# Patient Record
Sex: Female | Born: 1940 | Race: Black or African American | Hispanic: No
Health system: Southern US, Community
[De-identification: ages and names within clinical notes are randomized; demographics above are authoritative.]

## PROBLEM LIST (undated history)

## (undated) DIAGNOSIS — E119 Type 2 diabetes mellitus without complications: Secondary | ICD-10-CM

---

## 2018-03-10 ENCOUNTER — Ambulatory Visit
Admission: RE | Admit: 2018-03-10 | Discharge: 2018-03-10 | Disposition: A | Discharge status: Home / Self Care | Payer: No Typology Code available for payment source | Source: Ambulatory Visit | Attending: Internal Medicine | Admitting: Internal Medicine

## 2018-03-10 ENCOUNTER — Other Ambulatory Visit: Payer: Self-pay | Admitting: Internal Medicine

## 2018-03-10 DIAGNOSIS — A150 Tuberculosis of lung: Secondary | ICD-10-CM

## 2019-11-15 ENCOUNTER — Emergency Department (HOSPITAL_COMMUNITY): Payer: Self-pay

## 2019-11-15 ENCOUNTER — Encounter (HOSPITAL_COMMUNITY): Payer: Self-pay | Admitting: Emergency Medicine

## 2019-11-15 ENCOUNTER — Other Ambulatory Visit: Payer: Self-pay

## 2019-11-15 ENCOUNTER — Emergency Department (HOSPITAL_COMMUNITY)
Admission: EM | Admit: 2019-11-15 | Discharge: 2019-11-15 | Disposition: A | Discharge status: Home / Self Care | Payer: Self-pay | Attending: Emergency Medicine | Admitting: Emergency Medicine

## 2019-11-15 DIAGNOSIS — M25552 Pain in left hip: Secondary | ICD-10-CM | POA: Insufficient documentation

## 2019-11-15 DIAGNOSIS — E119 Type 2 diabetes mellitus without complications: Secondary | ICD-10-CM | POA: Insufficient documentation

## 2019-11-15 DIAGNOSIS — W1830XA Fall on same level, unspecified, initial encounter: Secondary | ICD-10-CM | POA: Insufficient documentation

## 2019-11-15 DIAGNOSIS — W19XXXA Unspecified fall, initial encounter: Secondary | ICD-10-CM

## 2019-11-15 HISTORY — DX: Type 2 diabetes mellitus without complications: E11.9

## 2019-11-15 MED ORDER — LIDOCAINE 5 % EX PTCH
1.0000 | MEDICATED_PATCH | CUTANEOUS | Status: DC
  Administered 2019-11-15: 1 via TRANSDERMAL
  Filled 2019-11-15: qty 1

## 2019-11-15 MED ORDER — LIDOCAINE 5 % EX OINT: 1.0000 "application " | TOPICAL_OINTMENT | CUTANEOUS | 0 refills | Status: AC | PRN

## 2019-11-15 MED ORDER — ACETAMINOPHEN 325 MG PO TABS
325.0000 mg | ORAL_TABLET | Freq: Once | ORAL | Status: AC
  Administered 2019-11-15: 325 mg via ORAL
  Filled 2019-11-15: qty 1

## 2019-11-15 NOTE — Discharge Instructions (Signed)
You were seen in the emergency department today with left hip pain. Your x-ray and CT scans were normal. Please use Tylenol and the lidocaine medication as prescribed. Return with any new or worsening symptoms. Please call your primary doctor first thing tomorrow morning to schedule the next available follow up appointment.

## 2019-11-15 NOTE — ED Notes (Signed)
Pt was discharged from the ED. Pt read and understood discharge paperwork. Pt had vital signs completed. Pt conscious, breathing, and A&Ox4. No distress noted. Pt speaking in complete sentences. Pt brought out of the ED via wheelchair.  

## 2019-11-15 NOTE — ED Triage Notes (Signed)
Pt reports falling last night due to her leg giving out on her. Pain to left hip and leg. Hx of DM. Denies hitting head.

## 2019-11-15 NOTE — ED Provider Notes (Signed)
Emergency Department Provider Note   I have reviewed the triage vital signs and the nursing notes.   HISTORY  Chief Complaint Fall  Family member interpreting at patient's request.  HPI Kristy Rodriguez is a 79 y.o. female with PMH of DM presents to the emergency department for evaluation of left hip pain after fall yesterday.  The patient states that she occasionally has falls but denies any presyncope type symptoms.  No chest pain, shortness of breath, palpitations.  She has pain in the left hip which is more posterior and radiating down the left leg.  No numbness or weakness.  No lower back pain.  She has been ambulatory on the hip but has had significant discomfort.  Family has been giving Tylenol for pain with only mild improvement in symptoms. No UTI symptoms or flank pain.    Past Medical History:  Diagnosis Date  . Diabetes mellitus without complication (HCC)    There are no problems to display for this patient.  Allergies Patient has no allergy information on record.  No family history on file.  Social History Social History   Tobacco Use  . Smoking status: Not on file  Substance Use Topics  . Alcohol use: Not on file  . Drug use: Not on file    Review of Systems  Constitutional: No fever/chills Eyes: No visual changes. ENT: No sore throat. Cardiovascular: Denies chest pain. Respiratory: Denies shortness of breath. Gastrointestinal: No abdominal pain.  No nausea, no vomiting.  No diarrhea.  No constipation. Genitourinary: Negative for dysuria. Musculoskeletal: Negative for back pain. Positive left hip/leg pain.  Skin: Negative for rash. Neurological: Negative for headaches, focal weakness or numbness.  10-point ROS otherwise negative.  ____________________________________________   PHYSICAL EXAM:  VITAL SIGNS: ED Triage Vitals  Enc Vitals Group     BP 11/15/19 1510 116/65     Pulse Rate 11/15/19 1510 92     Resp 11/15/19 1510 16     Temp  11/15/19 1510 98.3 F (36.8 C)     Temp Source 11/15/19 1510 Oral     SpO2 11/15/19 1510 97 %     Weight 11/15/19 1511 130 lb (59 kg)     Height 11/15/19 1511 5\' 5"  (1.651 m)   Constitutional: Alert and oriented. Well appearing and in no acute distress. Eyes: Conjunctivae are normal.  Head: Atraumatic. Nose: No congestion/rhinnorhea. Mouth/Throat: Mucous membranes are moist.   Neck: No stridor.   Cardiovascular: Normal rate, regular rhythm. Good peripheral circulation. Grossly normal heart sounds.   Respiratory: Normal respiratory effort.  No retractions. Lungs CTAB. Gastrointestinal: Soft and nontender. No distention.  Musculoskeletal: No lower extremity tenderness nor edema. No gross deformities of extremities.  Mild tenderness to palpation over the superior left buttocks.  No overlying skin changes, ecchymosis, abscess/cellulitis.  Pain with range of motion of the left hip.  No tenderness over the left knee or ankle.  No midline thoracic or lumbar spine tenderness.  Neurologic:  Normal speech and language. No gross focal neurologic deficits are appreciated.  Skin:  Skin is warm, dry and intact. No rash noted.  ____________________________________________  RADIOLOGY  CT Hip Left Wo Contrast  Result Date: 11/15/2019 CLINICAL DATA:  Left knee pain. Status post fall. EXAM: CT OF THE LEFT HIP WITHOUT CONTRAST TECHNIQUE: Multidetector CT imaging of the left hip was performed according to the standard protocol. Multiplanar CT image reconstructions were also generated. COMPARISON:  None. FINDINGS: Bones/Joint/Cartilage No fracture or dislocation. Normal alignment. No joint effusion.  No aggressive osseous lesion. Mild osteoarthritis of bilateral SI joints. Mild bilateral facet arthropathy at L5-S1. Ligaments Ligaments are suboptimally evaluated by CT. Muscles and Tendons Muscles are normal. No muscle atrophy. No intramuscular fluid collection or hematoma. Soft tissue No fluid collection or  hematoma. No soft tissue mass. Peripheral vascular atherosclerotic disease. IMPRESSION: No acute osseous injury of the left hip. Electronically Signed   By: Kathreen Devoid   On: 11/15/2019 19:53   DG Hip Unilat With Pelvis 2-3 Views Left  Result Date: 11/15/2019 CLINICAL DATA:  80 year old female status post fall last night with pain radiating to the left hip and leg. EXAM: DG HIP (WITH OR WITHOUT PELVIS) 2-3V LEFT COMPARISON:  None. FINDINGS: Femoral heads are normally located. Bone mineralization is within normal limits for age. Pelvis appears intact. SI joints appear symmetric. Incidental somewhat bulky dystrophic calcifications at both lower flanks. Negative visible bowel gas pattern. Grossly intact proximal right femur. AP and frogleg lateral views are provided of the proximal left femur which appears intact. IMPRESSION: No acute fracture or dislocation identified about the left hip or pelvis. Electronically Signed   By: Genevie Ann M.D.   On: 11/15/2019 16:11    ____________________________________________   PROCEDURES  Procedure(s) performed:   Procedures  None  ____________________________________________   INITIAL IMPRESSION / ASSESSMENT AND PLAN / ED COURSE  Pertinent labs & imaging results that were available during my care of the patient were reviewed by me and considered in my medical decision making (see chart for details).   Patient presents to the emergency department for evaluation of left hip pain after fall.  Plain films reviewed which show no acute fracture or dislocation.  Plan for CT imaging of the left hip.  The distribution of symptoms is consistent with sciatica.  She has no red flag signs or symptoms to suspect acute spine emergency requiring MRI.   08:10 PM  CT imaging of the left hip reviewed with no osseous abnormality.  Patient feeling somewhat improved after lidocaine ointment here in the emergency department.  Will send home with instructions to continue Tylenol  and lidocaine medications primarily.  Will hold on steroids or other stronger pain medicines given the patient's age and concern for increased fall risk and AMS.  Discussed this with the patient and daughter at bedside.  They will call the primary care doctor tomorrow morning for the next available appointment.  Discussed ED return precautions.  ____________________________________________  FINAL CLINICAL IMPRESSION(S) / ED DIAGNOSES  Final diagnoses:  Fall, initial encounter  Left hip pain     MEDICATIONS GIVEN DURING THIS VISIT:  Medications  lidocaine (LIDODERM) 5 % 1 patch (1 patch Transdermal Patch Applied 11/15/19 1950)  acetaminophen (TYLENOL) tablet 325 mg (325 mg Oral Given 11/15/19 1553)     NEW OUTPATIENT MEDICATIONS STARTED DURING THIS VISIT:  New Prescriptions   LIDOCAINE (XYLOCAINE) 5 % OINTMENT    Apply 1 application topically as needed.    Note:  This document was prepared using Dragon voice recognition software and may include unintentional dictation errors.  Nanda Quinton, MD, [facility]    Darleen Moffitt, [facility], MD 11/15/19 2018

## 2021-09-12 IMAGING — CR DG HIP (WITH OR WITHOUT PELVIS) 2-3V*L*
3 series · 3 of 3 positions shown · non-contrast
Comparison: None.

CLINICAL DATA: 78-year-old female status post fall last night with
pain radiating to the left hip and leg.

EXAM:
DG HIP (WITH OR WITHOUT PELVIS) 2-3V LEFT

[hip ap]
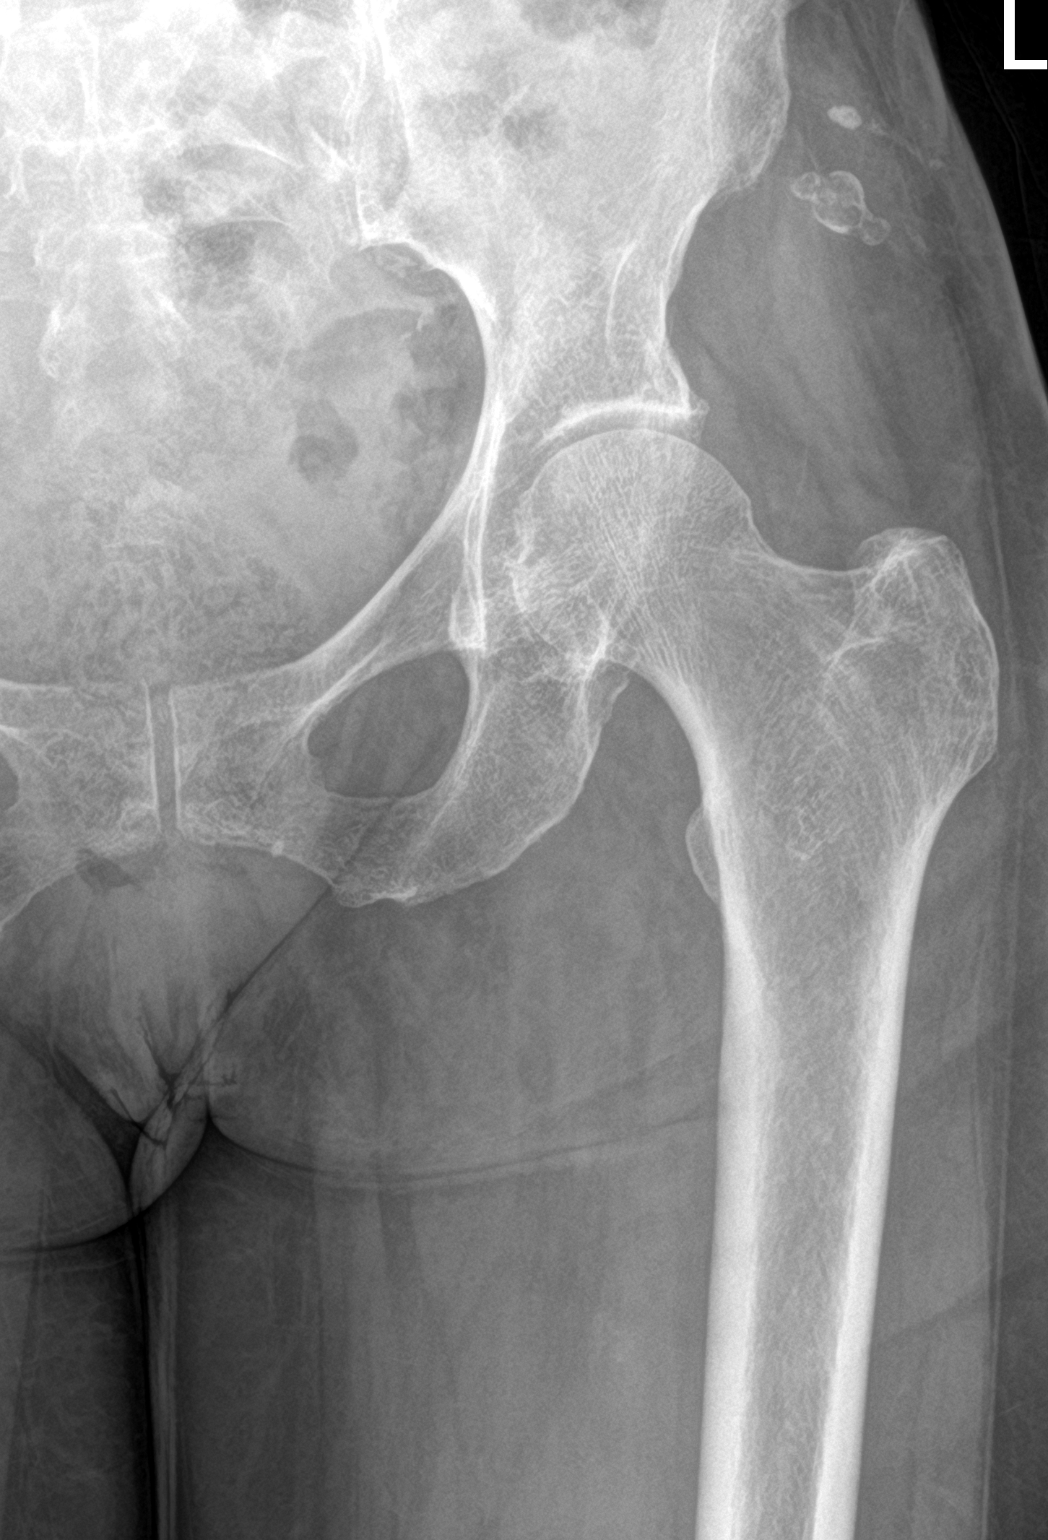

[hip lat]
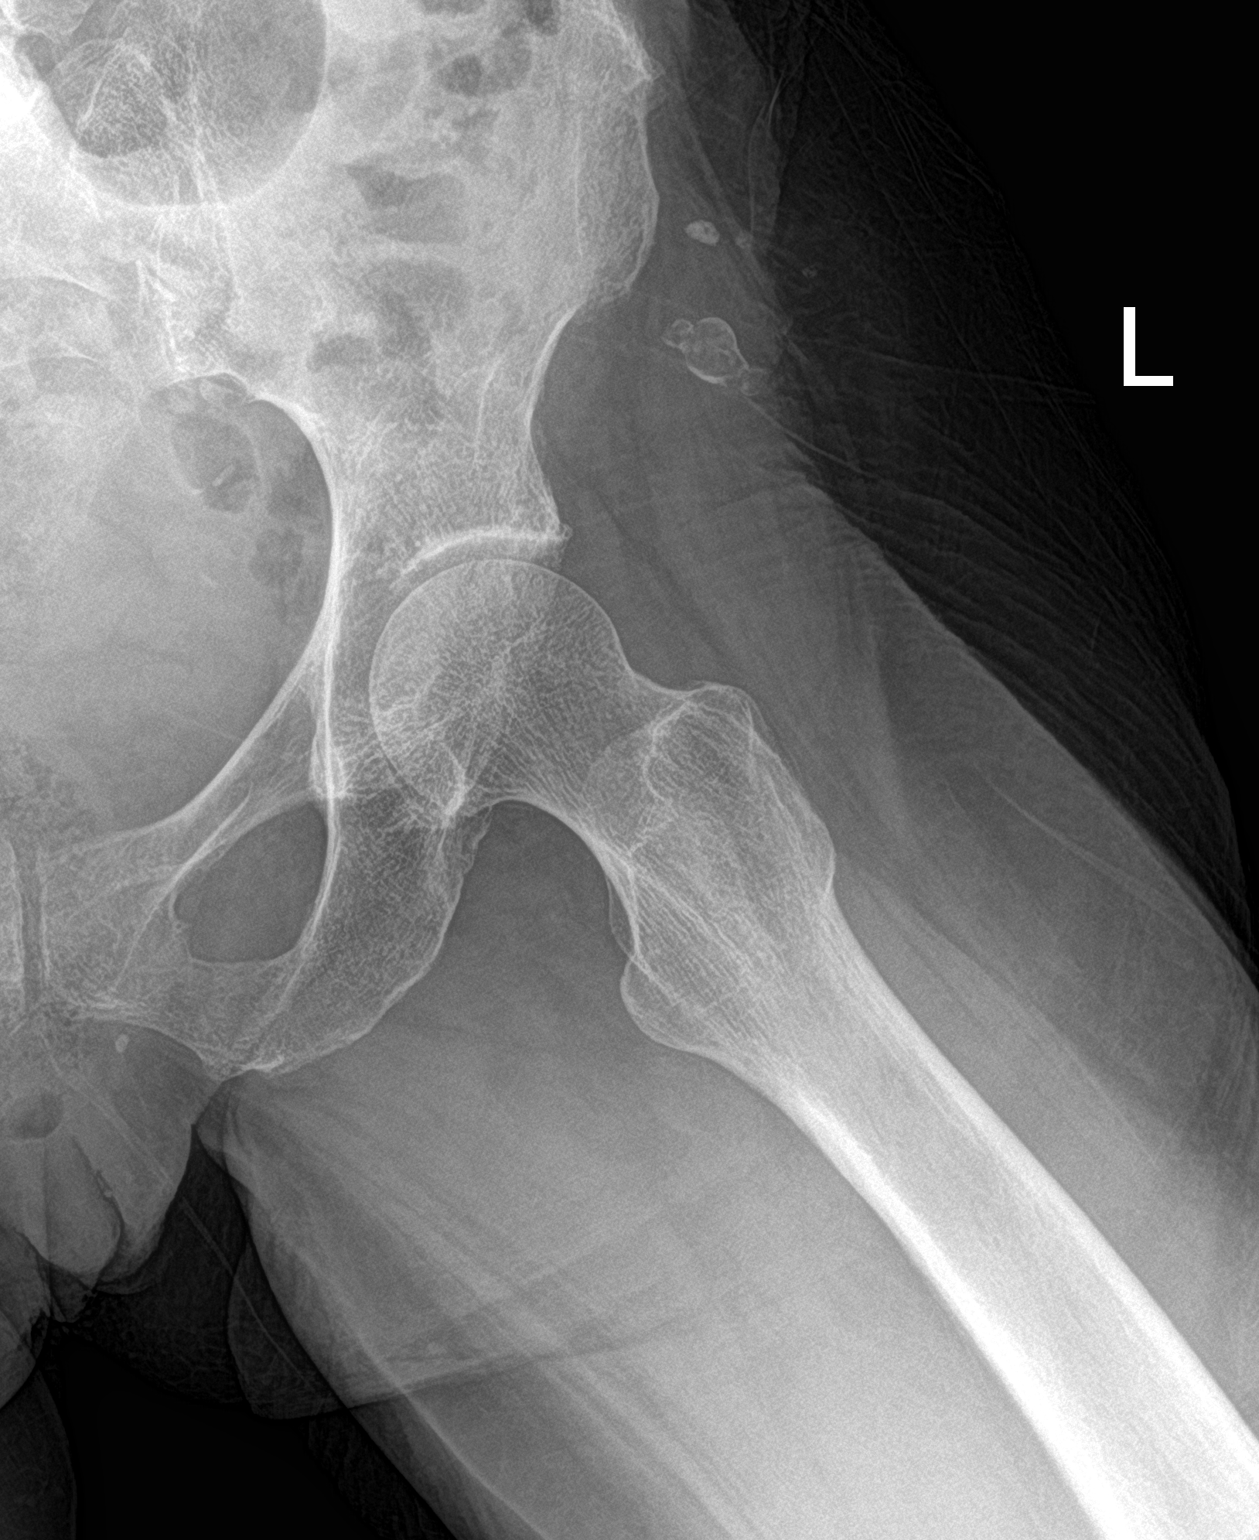

[pelvis ap]
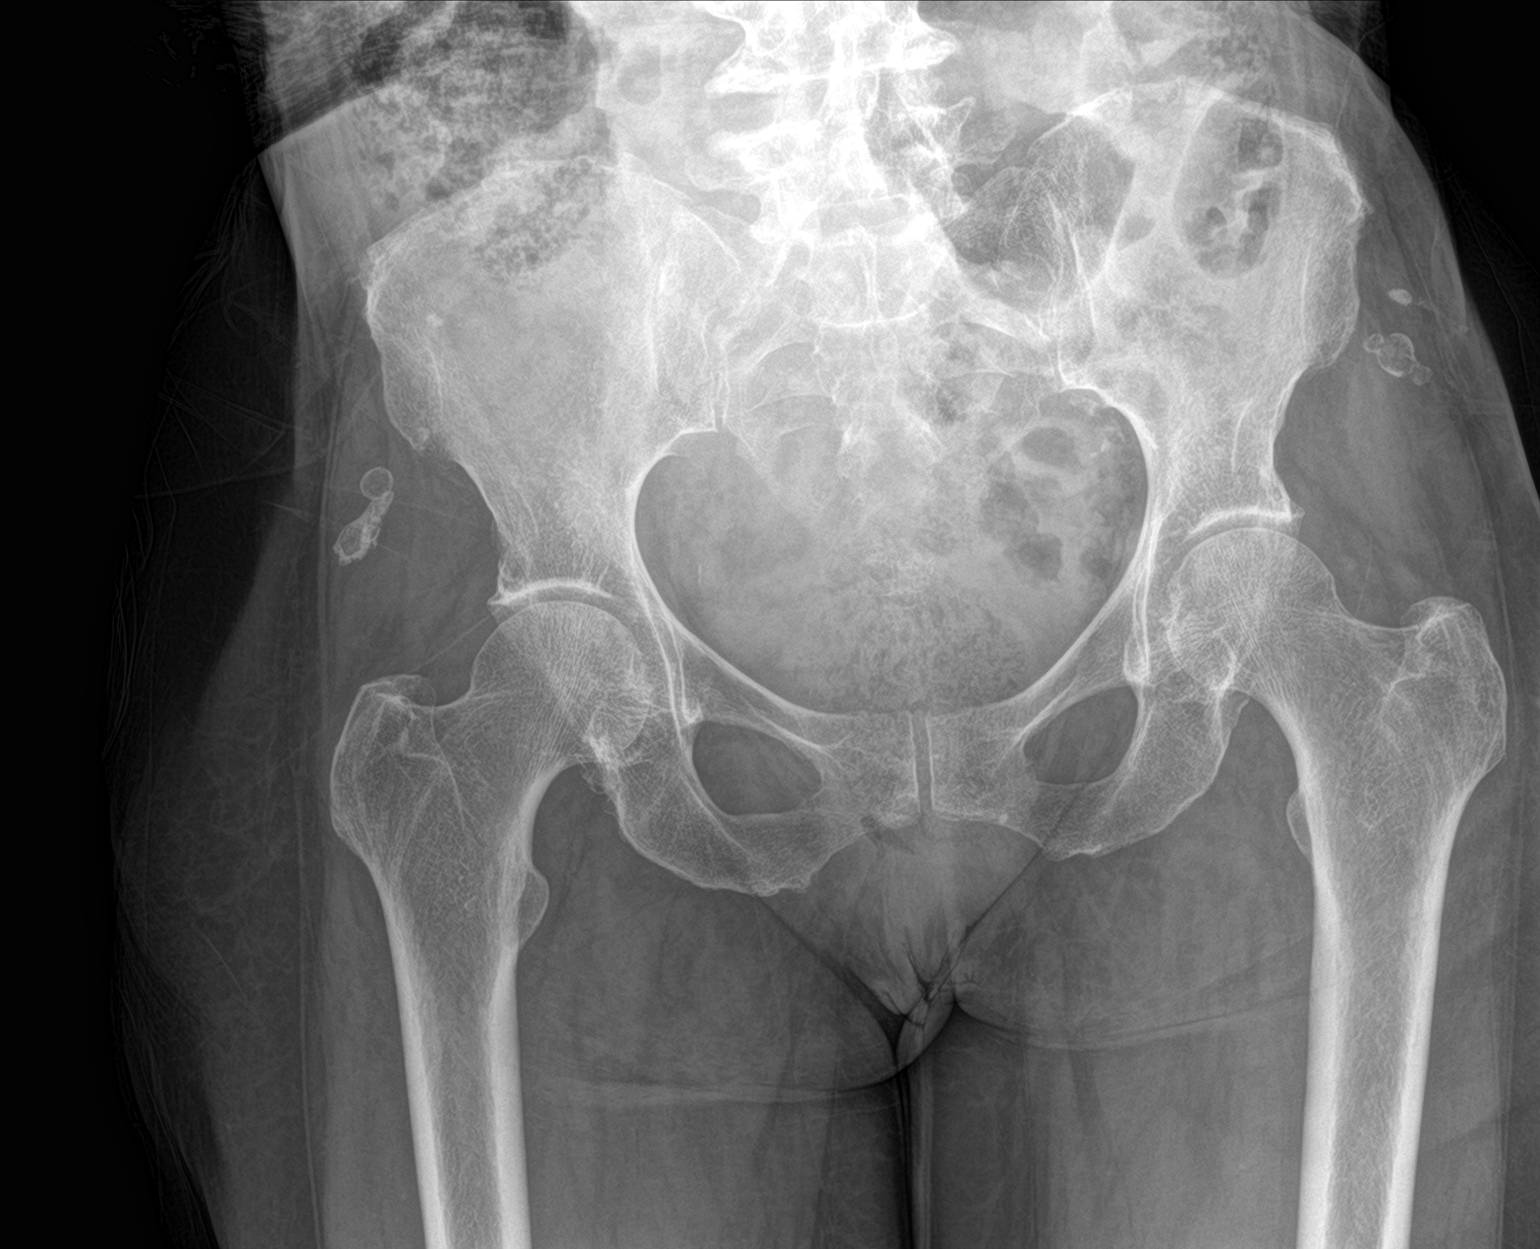

[3 of 3 positions shown; findings below may reference images not displayed]

FINDINGS: Femoral heads are normally located. Bone mineralization is within
normal limits for age. Pelvis appears intact. SI joints appear
symmetric. Incidental somewhat bulky dystrophic calcifications at
both lower flanks. Negative visible bowel gas pattern.

Grossly intact proximal right femur. AP and frogleg lateral views
are provided of the proximal left femur which appears intact.
IMPRESSION: No acute fracture or dislocation identified about the left hip or
pelvis.

## 2021-09-12 IMAGING — CT CT HIP*L* W/O CM
2 of 3 series · 17 of 46 positions shown, 19 images · non-contrast
Comparison: None.

CLINICAL DATA: Left knee pain. Status post fall.

EXAM:
CT OF THE LEFT HIP WITHOUT CONTRAST
TECHNIQUE: Multidetector CT imaging of the left hip was performed according to
the standard protocol. Multiplanar CT image reconstructions were
also generated.

[Series 5: hip 2.0 st · axial · 0.54mm/px · z∈[-739,-539]mm · 14 of 116 slices shown, 16 images]
[im 8/116  soft-tissue]
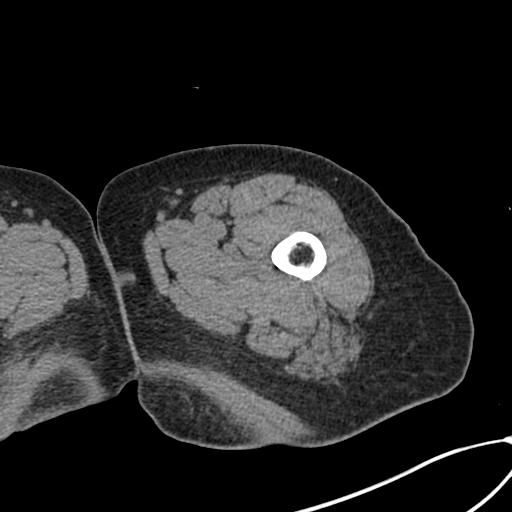
[im 8/116  bone]
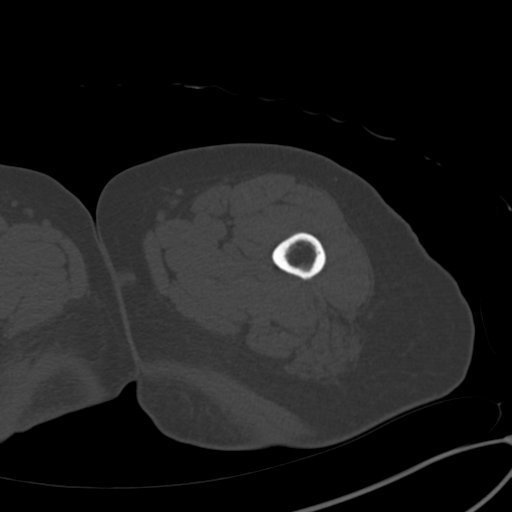
[im 15/116  soft-tissue]
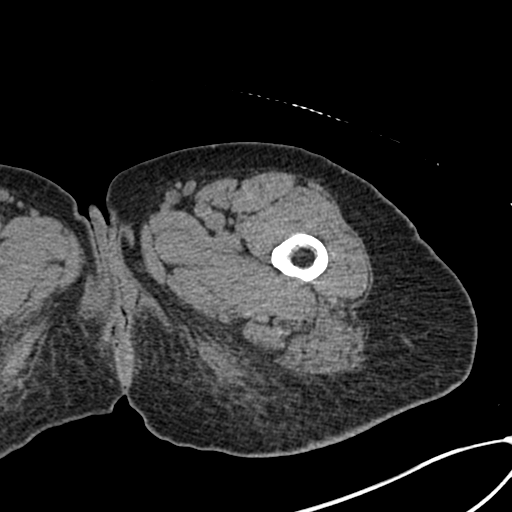
[im 23/116  soft-tissue]
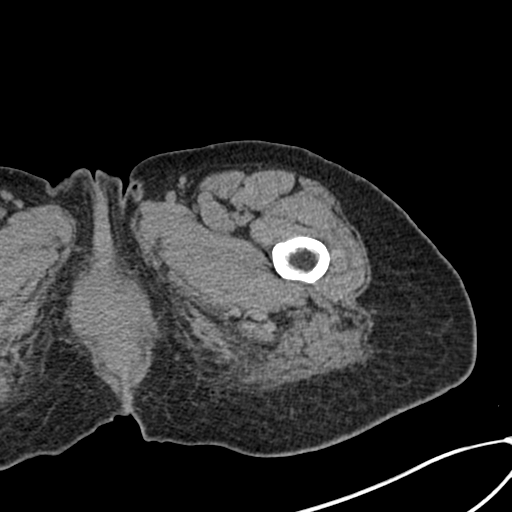
[im 30/116  soft-tissue]
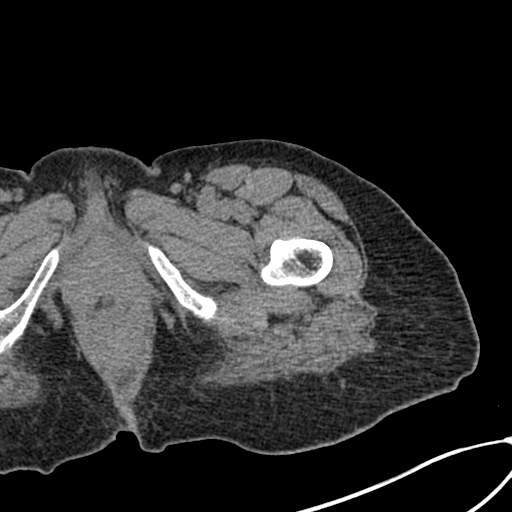
[im 38/116  soft-tissue]
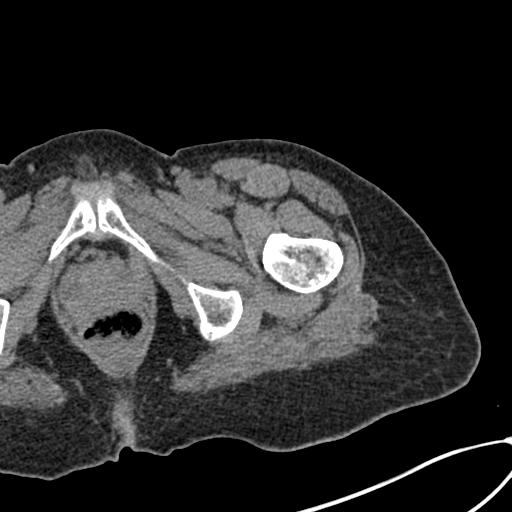
[im 45/116  soft-tissue]
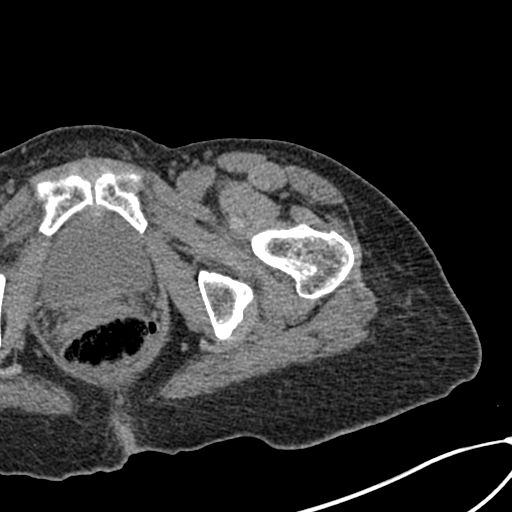
[im 52/116  soft-tissue]
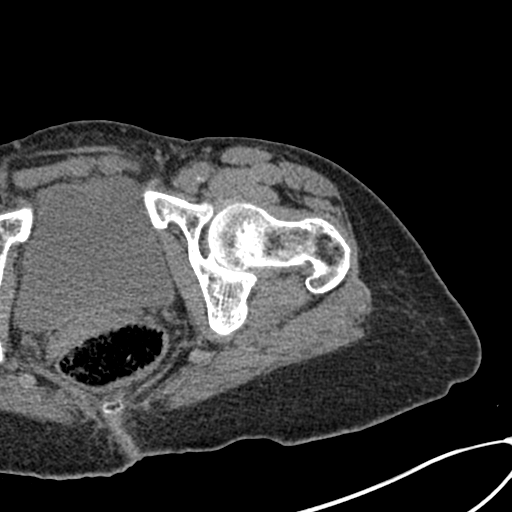
[im 64/116  soft-tissue]
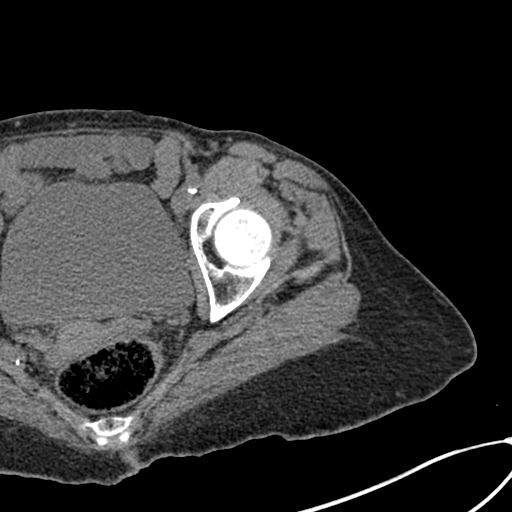
[im 71/116  soft-tissue]
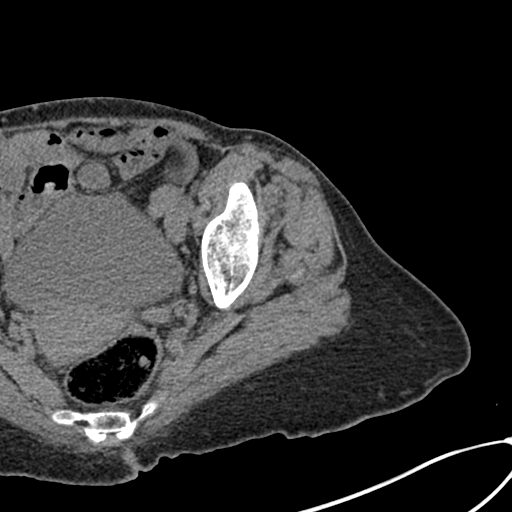
[im 71/116  bone]
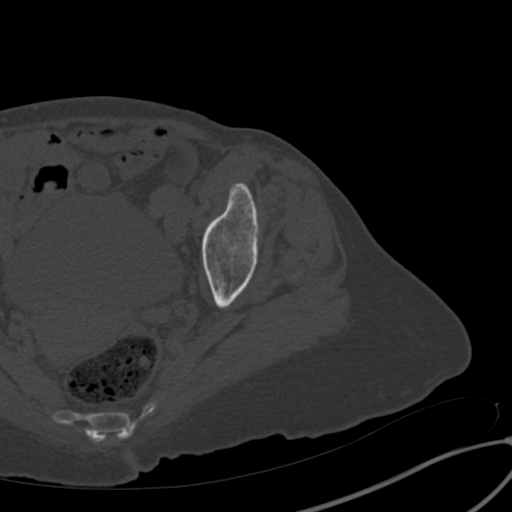
[im 78/116  soft-tissue]
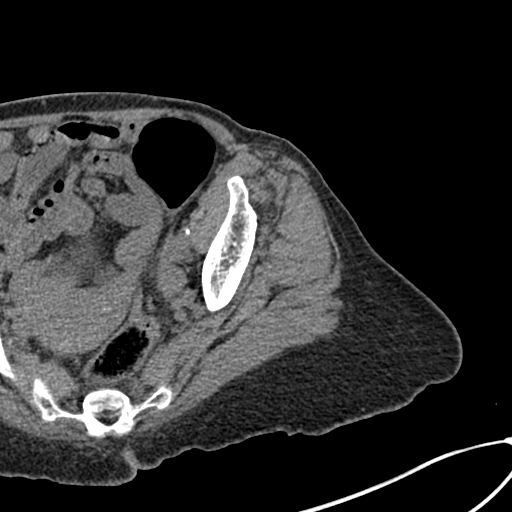
[im 86/116  soft-tissue]
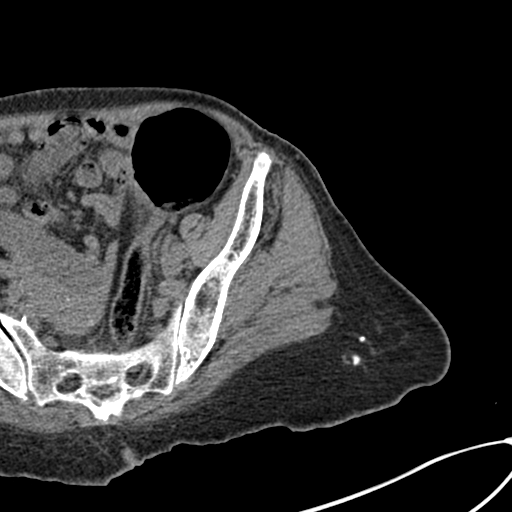
[im 93/116  soft-tissue]
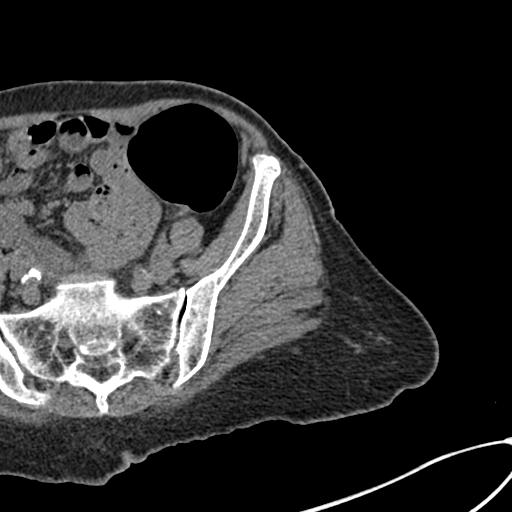
[im 101/116  soft-tissue]
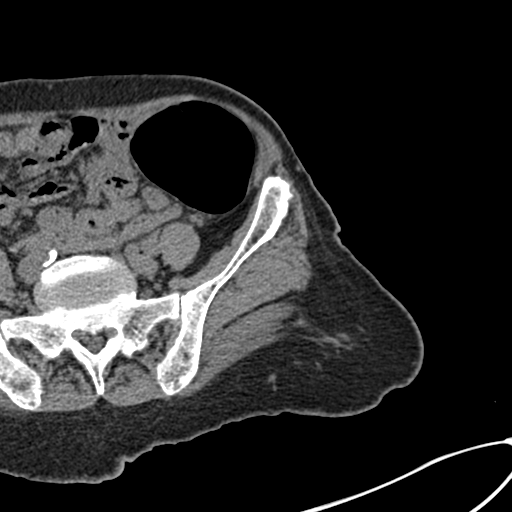
[im 108/116  soft-tissue]
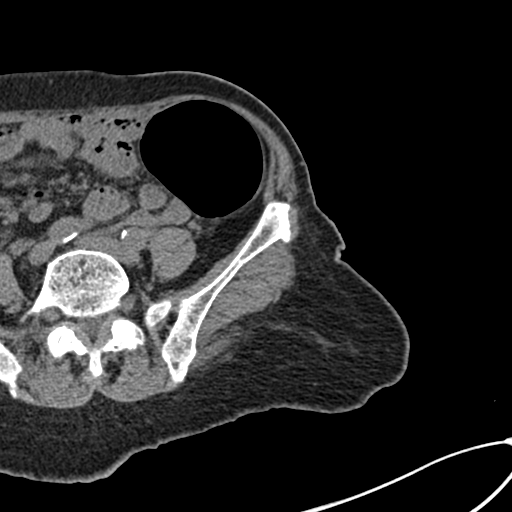

[Series 8: hip 2.0 cor. st · coronal · 0.45mm/px · 3 of 114 slices shown]
[im 38/114  soft-tissue]
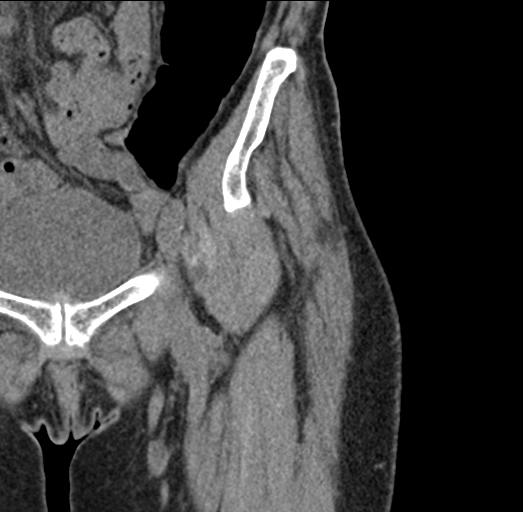
[im 51/114  soft-tissue]
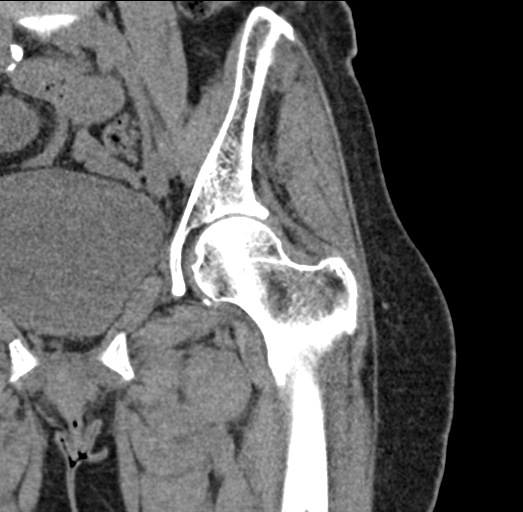
[im 63/114  soft-tissue]
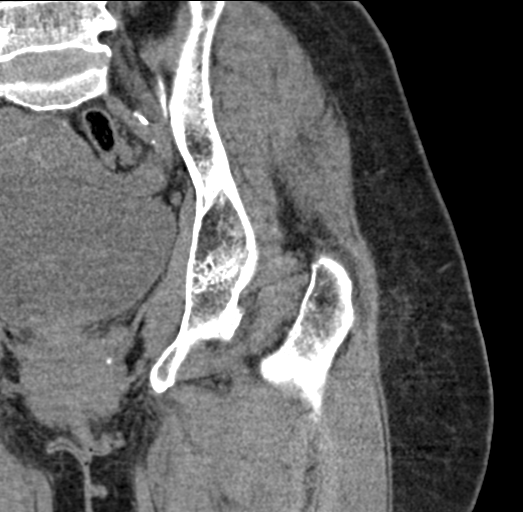

[17 of 46 positions shown; findings below may reference images not displayed]

FINDINGS: Bones/Joint/Cartilage

No fracture or dislocation. Normal alignment. No joint effusion. No
aggressive osseous lesion. Mild osteoarthritis of bilateral SI
joints. Mild bilateral facet arthropathy at L5-S1.

Ligaments

Ligaments are suboptimally evaluated by CT.

Muscles and Tendons
Muscles are normal. No muscle atrophy. No intramuscular fluid
collection or hematoma.

Soft tissue
No fluid collection or hematoma. No soft tissue mass. Peripheral
vascular atherosclerotic disease.
IMPRESSION: No acute osseous injury of the left hip.
